# Patient Record
Sex: Female | Born: 1970 | Race: White | Hispanic: No | Marital: Married
Health system: Southern US, Community
[De-identification: ages and names within clinical notes are randomized; demographics above are authoritative.]

## PROBLEM LIST (undated history)

## (undated) DIAGNOSIS — J189 Pneumonia, unspecified organism: Secondary | ICD-10-CM

---

## 2019-02-21 ENCOUNTER — Other Ambulatory Visit: Payer: Self-pay

## 2019-02-21 ENCOUNTER — Encounter (HOSPITAL_COMMUNITY): Payer: Self-pay | Admitting: Emergency Medicine

## 2019-02-21 ENCOUNTER — Emergency Department (HOSPITAL_COMMUNITY)
Admission: EM | Admit: 2019-02-21 | Discharge: 2019-02-21 | Disposition: A | Discharge status: Home / Self Care | Payer: BC Managed Care – PPO | Attending: Emergency Medicine | Admitting: Emergency Medicine

## 2019-02-21 ENCOUNTER — Emergency Department (HOSPITAL_COMMUNITY): Payer: BC Managed Care – PPO

## 2019-02-21 DIAGNOSIS — Z20828 Contact with and (suspected) exposure to other viral communicable diseases: Secondary | ICD-10-CM | POA: Insufficient documentation

## 2019-02-21 DIAGNOSIS — Z79899 Other long term (current) drug therapy: Secondary | ICD-10-CM | POA: Diagnosis not present

## 2019-02-21 DIAGNOSIS — R079 Chest pain, unspecified: Secondary | ICD-10-CM | POA: Diagnosis present

## 2019-02-21 DIAGNOSIS — R0789 Other chest pain: Secondary | ICD-10-CM

## 2019-02-21 HISTORY — DX: Pneumonia, unspecified organism: J18.9

## 2019-02-21 LAB — BASIC METABOLIC PANEL WITH GFR
Anion gap: 10 (ref 5–15)
BUN: 12 mg/dL (ref 6–20)
CO2: 29 mmol/L (ref 22–32)
Calcium: 9.9 mg/dL (ref 8.9–10.3)
Chloride: 102 mmol/L (ref 98–111)
Creatinine, Ser: 1.01 mg/dL — ABNORMAL HIGH (ref 0.44–1.00)
GFR calc Af Amer: >60 mL/min
GFR calc non Af Amer: >60 mL/min
Glucose, Bld: 92 mg/dL (ref 70–99)
Potassium: 3.7 mmol/L (ref 3.5–5.1)
Sodium: 141 mmol/L (ref 135–145)

## 2019-02-21 LAB — TROPONIN I (HIGH SENSITIVITY)
Troponin I (High Sensitivity): 4 ng/L (ref <18)
Troponin I (High Sensitivity): 4 ng/L

## 2019-02-21 LAB — CBC
HCT: 41.6 % (ref 36.0–46.0)
Hemoglobin: 14.1 g/dL (ref 12.0–15.0)
MCH: 30.6 pg (ref 26.0–34.0)
MCHC: 33.9 g/dL (ref 30.0–36.0)
MCV: 90.2 fL (ref 80.0–100.0)
Platelets: 198 10*3/uL (ref 150–400)
RBC: 4.61 MIL/uL (ref 3.87–5.11)
RDW: 11.9 % (ref 11.5–15.5)
WBC: 6.1 10*3/uL (ref 4.0–10.5)
nRBC: 0 % (ref 0.0–0.2)

## 2019-02-21 LAB — SARS CORONAVIRUS 2 BY RT PCR (HOSPITAL ORDER, PERFORMED IN ~~LOC~~ HOSPITAL LAB): SARS Coronavirus 2: NEGATIVE

## 2019-02-21 NOTE — Discharge Instructions (Addendum)
Dr. Boyce Medici,   You were seen in the ED today for chest pain. Your EKG in the ED was reassuring and your labs were negative for any cardiac cause of your chest pain. Your COVID is negative. Please follow up with your PCP for further assessment. If your symptoms worsen or you develop shortness of breath, diaphoresis or headache/dizziness, please seek emergent medical care.  Thank you!

## 2019-02-21 NOTE — ED Notes (Signed)
Pt verbalizes understanding of discharge instructions. Opportunity for questioning and answers were provided. Armband removed by staff, pt discharged from ED.  

## 2019-02-21 NOTE — ED Provider Notes (Signed)
[facility] Provider Note   CSN: [id number] Arrival date & time: 02/21/19  7673     History   Chief Complaint Chief Complaint  Patient presents with  . Chest Pain    HPI Felicia Watson is a 48 y.o. female with past medical history of bronchiectasis and migraines presenting to the ED via EMS from urgent care for left sided chest pain for 24 hours. History obtained by patient who is a reliable source. 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 She noticed a mild fever of 99.5 yesterday, generalized body aches, and left sided chest pain.  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 She was transferred to [facility] via EMS. En route, patient was given aspirin 325mg  and 80ml of NS with improvement of symptoms. Patient denies any shortness of breath, chills, nausea/vomiting, abdominal pain.   Past Medical History:  Diagnosis Date  . Pneumonia     OB History   No obstetric history on file.      Home Medications    Prior to Admission medications   Medication Sig Start Date End Date Taking? Authorizing Provider  estradiol (VIVELLE-DOT) 0.0375 MG/24HR Place 1 patch onto the skin every 14 (fourteen) days. 12/29/15  Yes [provider]  Galcanezumab-gnlm (EMGALITY) 120 MG/ML SOAJ Inject 120 mg as directed every 30 (thirty) days. 12/11/18  Yes [provider]  LORazepam (ATIVAN) 2 MG tablet Take 1 mg by mouth at bedtime.    Yes [provider]  progesterone (PROMETRIUM) 100 MG capsule Take 100 mg by mouth at bedtime.   Yes [provider]  SUMAtriptan (IMITREX) 6 MG/0.5ML SOLN injection Inject 6 mg into the skin as needed. 12/28/15  Yes [provider]  vortioxetine HBr (TRINTELLIX) 10 MG TABS tablet Take 10 mg by mouth daily.   Yes [provider]    Family History No family history on file.   Social History Social History   Tobacco Use  . Smoking status: Not on file  Substance Use Topics  . Alcohol use: Not on file  . Drug use: Not on file     Allergies   Midazolam, Chlorhexidine gluconate, and Doxycycline   Review of Systems Review of Systems  Constitutional: Positive for fever. Negative for activity change, chills and fatigue.  HENT: Negative for postnasal drip, sneezing, sore throat, trouble swallowing and voice change.   Eyes: Negative for photophobia, pain and visual disturbance.  Respiratory: Negative for cough, chest tightness, shortness of breath and wheezing.   Cardiovascular: Positive for chest pain. Negative for palpitations and leg swelling.  Gastrointestinal: Negative for abdominal distention, abdominal pain, blood in stool, constipation, diarrhea, nausea and vomiting.  Endocrine: Negative.   Genitourinary: Negative.   Musculoskeletal: Positive for myalgias. Negative for arthralgias, gait problem, neck pain and neck stiffness.  Skin: Negative.   Allergic/Immunologic: Negative.   Neurological: Negative for dizziness, syncope, light-headedness, numbness and headaches.  Hematological: Negative.   Psychiatric/Behavioral: Negative.      Physical Exam Updated Vital Signs BP 121/75   Pulse 65   Temp 98 F (36.7 C) (Oral)   Resp 14   SpO2 100%   Physical Exam Vitals signs reviewed.  Constitutional:      General: She is not in acute distress.    Appearance: She is well-developed and normal weight. She is not diaphoretic.  Neck:  Musculoskeletal: Normal range of motion and neck supple.  Cardiovascular:     Rate and Rhythm: Normal rate and regular rhythm.     Heart sounds: Normal heart sounds.  Pulmonary:     Effort: Pulmonary effort is normal.     Breath sounds: Normal breath sounds.  Chest:     Chest wall: No mass or tenderness.  Abdominal:     General: Bowel sounds are normal. There is no abdominal bruit.     Palpations: Abdomen is  soft. There is no mass.     Tenderness: There is no abdominal tenderness. There is no guarding.  Musculoskeletal: Normal range of motion.  Lymphadenopathy:     Cervical: No cervical adenopathy.  Skin:    General: Skin is warm and dry.     Capillary Refill: Capillary refill takes less than 2 seconds.  Neurological:     General: No focal deficit present.     Mental Status: She is alert and oriented to person, place, and time.     Cranial Nerves: No cranial nerve deficit.     Motor: No weakness.  Psychiatric:        Mood and Affect: Mood normal.        Behavior: Behavior normal.    ED Treatments / Results  Labs (all labs ordered are listed, but only abnormal results are displayed) Labs Reviewed  BASIC METABOLIC PANEL - Abnormal; Notable for the following components:      Result Value   Creatinine, Ser 1.01 (*)    All other components within normal limits  SARS CORONAVIRUS 2 (HOSPITAL ORDER, PERFORMED IN  HOSPITAL LAB)  CBC  TROPONIN I (HIGH SENSITIVITY)  TROPONIN I (HIGH SENSITIVITY)    EKG EKG Interpretation  Date/Time:  Wednesday February 21 2019 10:08:21 EDT Ventricular Rate:  69 PR Interval:    QRS Duration: 78 QT Interval:  419 QTC Calculation: 449 R Axis:   87 Text Interpretation:  Sinus rhythm Baseline wander Otherwise within normal limits Confirmed by Gerhard Munch [phone]) on 02/21/2019 10:57:58 AM   Radiology Dg Chest Port 1 View  Result Date: 02/21/2019 CLINICAL DATA:  Chest pain EXAM: PORTABLE CHEST 1 VIEW COMPARISON:  None. FINDINGS: There is rather minimal atelectatic change in the left mid lung. Lungs elsewhere clear. Heart size and pulmonary vascularity are normal. No adenopathy. There is an apparent nipple shadow on the right. No pneumothorax. No bone lesions. IMPRESSION: No edema or consolidation.  Slight atelectasis left mid lung. Electronically Signed   By: Bretta Bang III M.D.   On: 02/21/2019 10:05    Procedures Procedures  (including critical care time)  Medications Ordered in ED Medications - No data to display   Initial Impression / Assessment and Plan / ED Course  I have reviewed the triage vital signs and the nursing notes.  Pertinent labs & imaging results that were available during my care of the patient were reviewed by me and considered in my medical decision making (see chart for details).  Patient is a 48yo female with history of migraines and bronchiectasis presenting with concerns of left sided chest pain since yesterday. Patient reports that she has also had generalized body aches and Tmax of 99.5 yesterday. Patient denies any chills, no known direct COVID contacts but lives with a cousin who was recently exposed to COVID and is quarantining at home. Patient denies any cough, shortness of breath or upper respiratory symptoms.  Patient's chest pain started yesterday without any preceding trauma. Pain is  left sided, non radiating, dull in nature with intermittent episodes of sharp pain. She denies any direct trauma to the area. The pain is not worsened with activity. Patient was seen in urgent care for this with EKG showing anterior infarct for which she was subsequently transferred to the ED. En route, EMS gave her aspirin 325mg  and 22mL of IVF. Patient reported some improvement in chest pain as a result of the aspirin. On examination, patient is in no acute distress and is resting comfortably in bed. Patient is afebrile and hemodynamically stable. Cardiopulmonary examination is negative for any murmurs, rubs or gallops or abnormal heart sounds and lungs are clear to auscultation bilaterally. Peripheral pulses intact and palpable.  EKG here with normal sinus rhythm. No acute ST changes noted. Serial troponins negative. CXR with slight atelectasis at mid lung lobe but no edema or consolidation noted. COVID negative. Patient was observed in the ED and remained hemodynamically stable and chest pain is minimal.  Suspect that her chest pain and generalized body aches secondary to viral illness. Patient discharged to home with strict return precautions. Patient and husband at bedside agreed with plan.    Final Clinical Impressions(s) / ED Diagnoses   Final diagnoses:  Atypical chest pain    ED Discharge Orders    None       Eliezer Bottom, MD 02/21/19 1343    Gerhard Munch, MD 02/22/19 [phone]

## 2019-02-21 NOTE — ED Triage Notes (Signed)
Pt BIB ems from urgent care for L side chest pain that started yesterday. EMS gave 325 mg of aspirin and approx. 50 ml of NS. VSS, lung sounds clear. Hx of chronic bronchiectasis, schedule bronchoscopy in October.

## 2019-02-22 ENCOUNTER — Telehealth (HOSPITAL_COMMUNITY): Payer: Self-pay

## 2019-02-22 NOTE — Telephone Encounter (Signed)
Pt.s husband called and required about the SARS test that was performed on the patient yesterday,.  He wanted to verify that the test was a Molecular PCR test. Pt has developed fever, chest pain.   I spoke with our lab and she verified that it was.  I informed pt.s husband and enc ouraged him to bring pt. Back if she was not feeling any better and developing worse symptoms.  He verbalized understanding and all questions answered.

## 2020-09-04 IMAGING — DX DG CHEST 1V PORT
1 series · 1 of 1 positions shown · non-contrast
Comparison: None.

CLINICAL DATA: Chest pain

EXAM:
PORTABLE CHEST 1 VIEW

[chest]
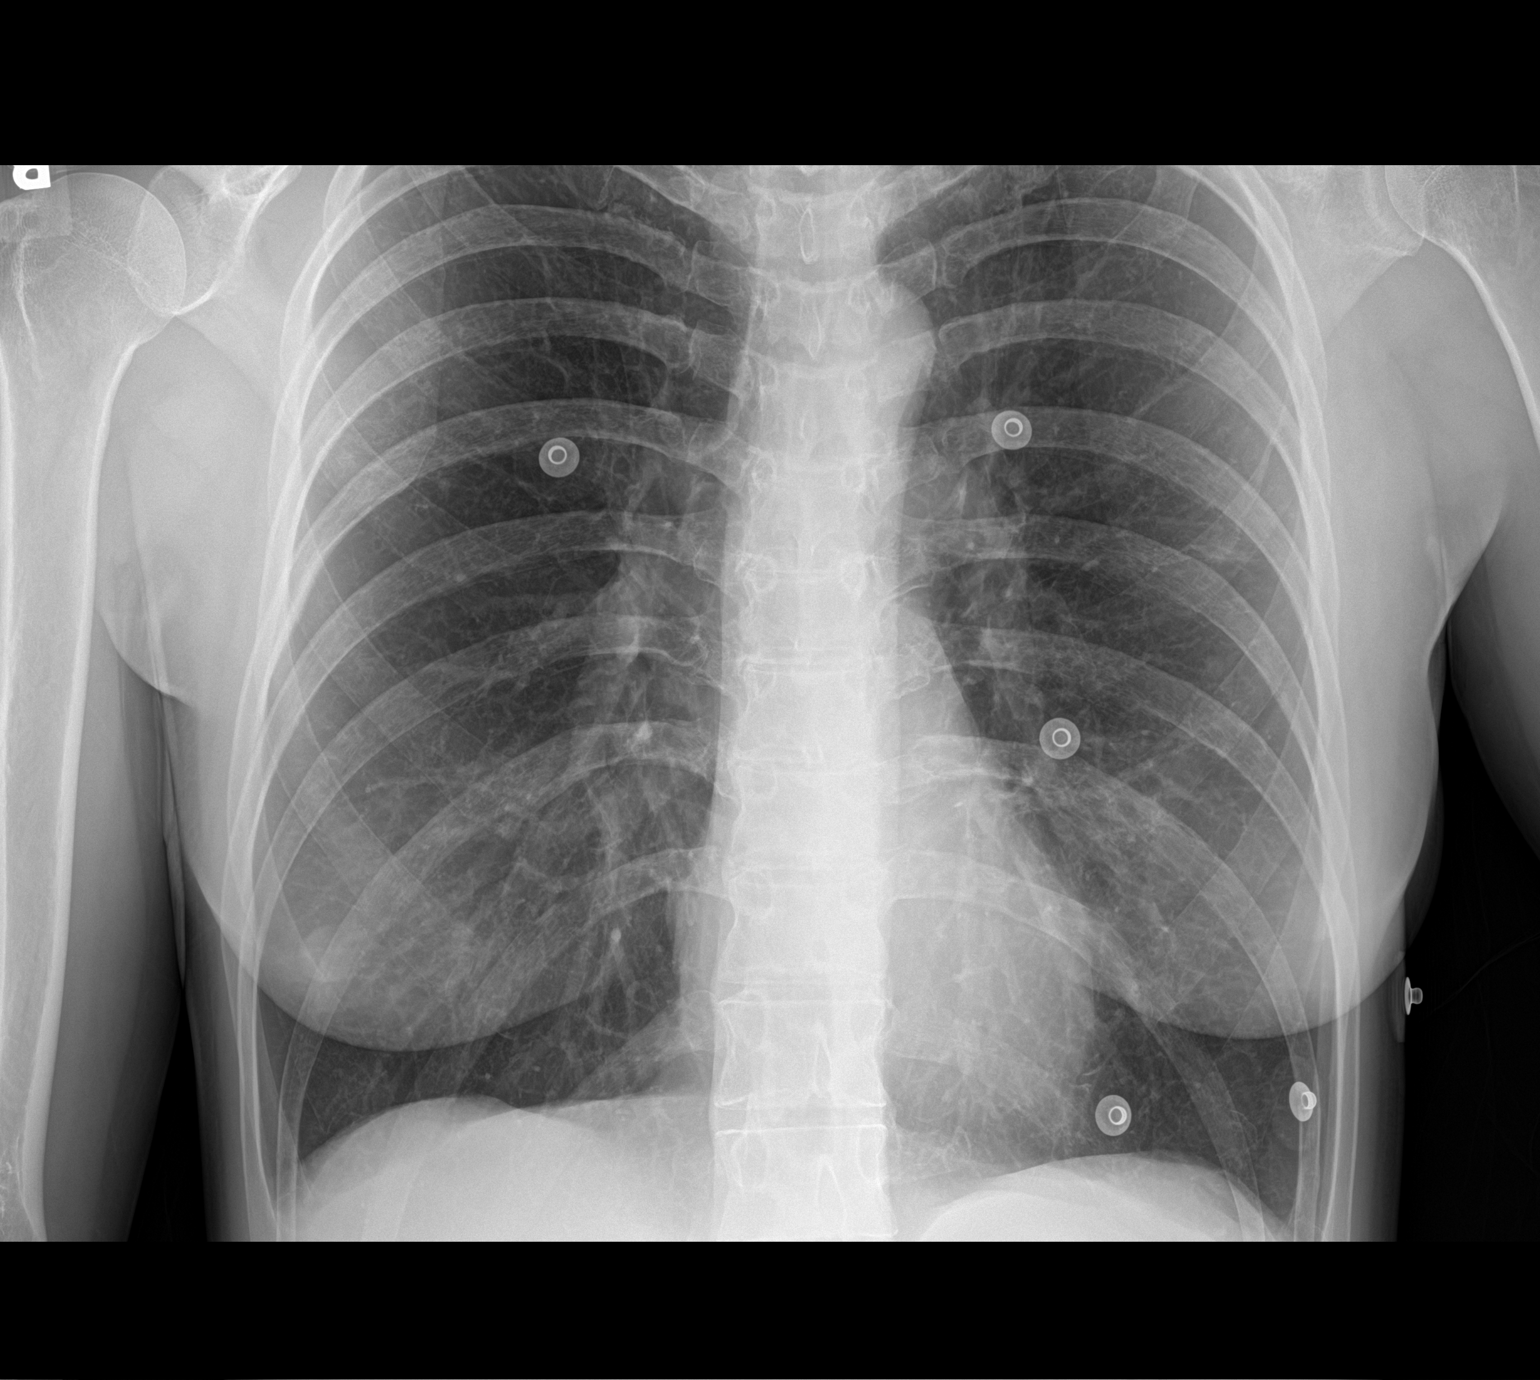

[1 of 1 positions shown; findings below may reference images not displayed]

FINDINGS: There is rather minimal atelectatic change in the left mid lung.
Lungs elsewhere clear. Heart size and pulmonary vascularity are
normal. No adenopathy. There is an apparent nipple shadow on the
right. No pneumothorax. No bone lesions.
IMPRESSION: No edema or consolidation.  Slight atelectasis left mid lung.
